# Patient Record
Sex: Male | Born: 1985 | Race: White | Hispanic: No | Marital: Single | Smoking: Never smoker
Health system: Southern US, Community
[De-identification: ages and names within clinical notes are randomized; demographics above are authoritative.]

## PROBLEM LIST (undated history)

## (undated) DIAGNOSIS — E559 Vitamin D deficiency, unspecified: Secondary | ICD-10-CM

## (undated) DIAGNOSIS — E7800 Pure hypercholesterolemia, unspecified: Secondary | ICD-10-CM

## (undated) DIAGNOSIS — I10 Essential (primary) hypertension: Secondary | ICD-10-CM

## (undated) DIAGNOSIS — R748 Abnormal levels of other serum enzymes: Secondary | ICD-10-CM

## (undated) HISTORY — PX: TONSILLECTOMY: SUR1361

---

## 2007-04-03 ENCOUNTER — Emergency Department (HOSPITAL_COMMUNITY): Admission: EM | Admit: 2007-04-03 | Discharge: 2007-04-04 | Payer: Self-pay | Admitting: Emergency Medicine

## 2012-10-11 ENCOUNTER — Ambulatory Visit (INDEPENDENT_AMBULATORY_CARE_PROVIDER_SITE_OTHER): Payer: BC Managed Care – PPO | Admitting: Family Medicine

## 2012-10-11 VITALS — BP 144/79 | HR 112 | Temp 101.0°F | Resp 16 | Ht 70.0 in | Wt 177.0 lb

## 2012-10-11 DIAGNOSIS — D239 Other benign neoplasm of skin, unspecified: Secondary | ICD-10-CM

## 2012-10-11 DIAGNOSIS — D229 Melanocytic nevi, unspecified: Secondary | ICD-10-CM

## 2012-10-11 DIAGNOSIS — IMO0001 Reserved for inherently not codable concepts without codable children: Secondary | ICD-10-CM

## 2012-10-11 DIAGNOSIS — M791 Myalgia, unspecified site: Secondary | ICD-10-CM

## 2012-10-11 DIAGNOSIS — R110 Nausea: Secondary | ICD-10-CM

## 2012-10-11 DIAGNOSIS — R509 Fever, unspecified: Secondary | ICD-10-CM

## 2012-10-11 DIAGNOSIS — J111 Influenza due to unidentified influenza virus with other respiratory manifestations: Secondary | ICD-10-CM

## 2012-10-11 LAB — POCT INFLUENZA A/B: Influenza B, POC: NEGATIVE

## 2012-10-11 MED ORDER — HYDROCODONE-HOMATROPINE 5-1.5 MG/5ML PO SYRP: 5.0000 mL | ORAL_SOLUTION | ORAL | Status: DC | PRN

## 2012-10-11 MED ORDER — OSELTAMIVIR PHOSPHATE 75 MG PO CAPS: 75.0000 mg | ORAL_CAPSULE | Freq: Two times a day (BID) | ORAL | Status: DC

## 2012-10-11 MED ORDER — PROMETHAZINE HCL 25 MG PO TABS: 25.0000 mg | ORAL_TABLET | Freq: Three times a day (TID) | ORAL | Status: DC | PRN

## 2012-10-11 NOTE — Patient Instructions (Addendum)
I will arrange when to remove the moles.  Take photos of them.    Influenza A (H1N1) H1N1 formerly called "swine flu" is a new influenza virus causing sickness in people. The H1N1 virus is different from seasonal influenza viruses. However, the H1N1 symptoms are similar to seasonal influenza and it is spread from person to person. You may be at higher risk for serious problems if you have underlying serious medical conditions. The CDC and the [employer] are following reported cases around the world. CAUSES   The flu is thought to spread mainly person-to-person through coughing or sneezing of infected people.  A person may become infected by touching something with the virus on it and then touching their mouth or nose. SYMPTOMS   Fever.  Headache.  Tiredness.  Cough.  Sore throat.  Runny or stuffy nose.  Body aches.  Diarrhea and vomiting These symptoms are referred to as "flu-like symptoms." A lot of different illnesses, including the common cold, may have similar symptoms. DIAGNOSIS   There are tests that can tell if you have the H1N1 virus.  Confirmed cases of H1N1 will be reported to the state or local health department.  A doctor's exam may be needed to tell whether you have an infection that is a complication of the flu. HOME CARE INSTRUCTIONS   Stay informed. Visit the [facility] website for current recommendations. Visit [employer]. You may also call 1-800-CDC-INFO (905-824-4225).  Get help early if you develop any of the above symptoms.  If you are at high risk from complications of the flu, talk to your caregiver as soon as you develop flu-like symptoms. Those at higher risk for complications include:  People 65 years or older.  People with chronic medical conditions.  Pregnant women.  Young children.  Your caregiver may recommend antiviral medicine to help treat the flu.  If you get the flu, get plenty of rest, drink enough water and  fluids to keep your urine clear or pale yellow, and avoid using alcohol or tobacco.  You may take over-the-counter medicine to relieve the symptoms of the flu if your caregiver approves. (Never give aspirin to children or teenagers who have flu-like symptoms, particularly fever). TREATMENT  If you do get sick, antiviral drugs are available. These drugs can make your illness milder and make you feel better faster. Treatment should start soon after illness starts. It is only effective if taken within the first day of becoming ill. Only your caregiver can prescribe antiviral medication.  PREVENTION   Cover your nose and mouth with a tissue or your arm when you cough or sneeze. Throw the tissue away.  Wash your hands often with soap and warm water, especially after you cough or sneeze. Alcohol-based cleaners are also effective against germs.  Avoid touching your eyes, nose or mouth. This is one way germs spread.  Try to avoid contact with sick people. Follow public health advice regarding school closures. Avoid crowds.  Stay home if you get sick. Limit contact with others to keep from infecting them. People infected with the H1N1 virus may be able to infect others anywhere from 1 day before feeling sick to 5-7 days after getting flu symptoms.  An H1N1 vaccine is available to help protect against the virus. In addition to the H1N1 vaccine, you will need to be vaccinated for seasonal influenza. The H1N1 and seasonal vaccines may be given on the same day. The CDC especially recommends the H1N1 vaccine for:  Pregnant women.  People who live with or care for children younger than 60 months of age.  Health care and emergency services personnel.  Persons between the ages of 46 months through 23 years of age.  People from ages 21 through 14 years who are at higher risk for H1N1 because of chronic health disorders or immune system problems. FACEMASKS In community and home settings, the use of facemasks  and N95 respirators are not normally recommended. In certain circumstances, a facemask or N95 respirator may be used for persons at increased risk of severe illness from influenza. Your caregiver can give additional recommendations for facemask use. IN CHILDREN, EMERGENCY WARNING SIGNS THAT NEED URGENT MEDICAL CARE:  Fast breathing or trouble breathing.  Bluish skin color.  Not drinking enough fluids.  Not waking up or not interacting normally.  Being so fussy that the child does not want to be held.  Your child has an oral temperature above 102 F (38.9 C), not controlled by medicine.  Your baby is older than 3 months with a rectal temperature of 102 F (38.9 C) or higher.  Your baby is 89 months old or younger with a rectal temperature of 100.4 F (38 C) or higher.  Flu-like symptoms improve but then return with fever and worse cough. IN ADULTS, EMERGENCY WARNING SIGNS THAT NEED URGENT MEDICAL CARE:  Difficulty breathing or shortness of breath.  Pain or pressure in the chest or abdomen.  Sudden dizziness.  Confusion.  Severe or persistent vomiting.  Bluish color.  You have a oral temperature above 102 F (38.9 C), not controlled by medicine.  Flu-like symptoms improve but return with fever and worse cough. SEEK IMMEDIATE MEDICAL CARE IF:  You or someone you know is experiencing any of the above symptoms. When you arrive at the emergency center, report that you think you have the flu. You may be asked to wear a mask and/or sit in a secluded area to protect others from getting sick. MAKE SURE YOU:   Understand these instructions.  Will watch your condition.  Will get help right away if you are not doing well or get worse. Some of this information courtesy of the CDC.  Document Released: 02/15/2008 Document Revised: 11/21/2011 Document Reviewed: 02/15/2008 [facility] Patient Information 2013 [place], [place].

## 2012-10-11 NOTE — Progress Notes (Signed)
Subjective: Patient has been having a lot of aching, fever, some nausea, and just feeling lousy the last couple of days. Actually started night before last.  Also he has some moles his wife insisted he get checked.  Objective TMs normal. Nose clear. Throat clear. Neck supple without nodes. Chest clear to auscultation. Heart regular without murmurs. He has scattered compound nevi on his back. He says the one on the left lower back has been growing more rapidly and is concerned about that. He has a pink looking lesion on his left scapula.   Results for orders placed in visit on 10/11/12  POCT INFLUENZA A/B      Component Value Range   Influenza A, POC Positive     Influenza B, POC Negative     Assessment: Influenza A Multiple compound nevi, some of them changing, all benign-appearing Hypopigmentary lesion left scapula  Plan: He is concerned about skin lesions. I told him we would take the one that is growing off some time, as well as the take place on his left shoulder. I will try to contact him on this. He will call me if he does not hear from me in the next couple weeks.

## 2013-09-22 ENCOUNTER — Emergency Department (HOSPITAL_COMMUNITY): Payer: BC Managed Care – PPO

## 2013-09-22 ENCOUNTER — Emergency Department (HOSPITAL_COMMUNITY)
Admission: EM | Admit: 2013-09-22 | Discharge: 2013-09-22 | Disposition: A | Discharge status: Home / Self Care | Payer: BC Managed Care – PPO | Attending: Emergency Medicine | Admitting: Emergency Medicine

## 2013-09-22 ENCOUNTER — Encounter (HOSPITAL_COMMUNITY): Payer: Self-pay | Admitting: Emergency Medicine

## 2013-09-22 DIAGNOSIS — S61451A Open bite of right hand, initial encounter: Secondary | ICD-10-CM

## 2013-09-22 DIAGNOSIS — Z792 Long term (current) use of antibiotics: Secondary | ICD-10-CM | POA: Insufficient documentation

## 2013-09-22 DIAGNOSIS — S61409A Unspecified open wound of unspecified hand, initial encounter: Secondary | ICD-10-CM | POA: Insufficient documentation

## 2013-09-22 DIAGNOSIS — W540XXA Bitten by dog, initial encounter: Secondary | ICD-10-CM | POA: Insufficient documentation

## 2013-09-22 DIAGNOSIS — Y9389 Activity, other specified: Secondary | ICD-10-CM | POA: Insufficient documentation

## 2013-09-22 DIAGNOSIS — Y929 Unspecified place or not applicable: Secondary | ICD-10-CM | POA: Insufficient documentation

## 2013-09-22 DIAGNOSIS — S61509A Unspecified open wound of unspecified wrist, initial encounter: Secondary | ICD-10-CM | POA: Insufficient documentation

## 2013-09-22 MED ORDER — AMOXICILLIN-POT CLAVULANATE 875-125 MG PO TABS
1.0000 | ORAL_TABLET | Freq: Once | ORAL | Status: AC
  Administered 2013-09-22: 1 via ORAL
  Filled 2013-09-22: qty 1

## 2013-09-22 MED ORDER — OXYCODONE-ACETAMINOPHEN 5-325 MG PO TABS: 2.0000 | ORAL_TABLET | Freq: Four times a day (QID) | ORAL | Status: DC | PRN

## 2013-09-22 MED ORDER — OXYCODONE-ACETAMINOPHEN 5-325 MG PO TABS
2.0000 | ORAL_TABLET | Freq: Once | ORAL | Status: AC
  Administered 2013-09-22: 2 via ORAL
  Filled 2013-09-22: qty 2

## 2013-09-22 MED ORDER — AMOXICILLIN-POT CLAVULANATE 875-125 MG PO TABS: 1.0000 | ORAL_TABLET | Freq: Two times a day (BID) | ORAL | Status: DC

## 2013-09-22 NOTE — ED Provider Notes (Signed)
CSN: [id number]     Arrival date & time 09/22/13  0123 History   First MD Initiated Contact with Patient 09/22/13 0407     Chief Complaint  Patient presents with  . Animal Bite   (Consider location/radiation/quality/duration/timing/severity/associated sxs/prior Treatment) HPI 28 year old male presents for his apartment with complaint of dog bite to right hand.  He reports that his dog bit him.  This evening when he was trying to get him off the bed.  Patient reports daughter.  He was shot is up-to-date.  His tetanus shot is up to date.  He denies any difficulty moving his fingers.  No redness, swelling, or drainage of pus.  At this time.  He does have some pain with movement of his hand, wrist, and fingers due to bite.  No antibiotic allergies. History reviewed. No pertinent past medical history. Past Surgical History  Procedure Laterality Date  . Tonsillectomy     No family history on file. History  Substance Use Topics  . Smoking status: Never Smoker   . Smokeless tobacco: Not on file  . Alcohol Use: No    Review of Systems  See History of Present Illness; otherwise all other systems are reviewed and negative Allergies  Ketorolac  Home Medications   Current Outpatient Rx  Name  Route  Sig  Dispense  Refill  . amoxicillin-clavulanate (AUGMENTIN) 875-125 MG per tablet   Oral   Take 1 tablet by mouth 2 (two) times daily.   20 tablet   0   . oxyCODONE-acetaminophen (PERCOCET/ROXICET) 5-325 MG per tablet   Oral   Take 2 tablets by mouth every 6 (six) hours as needed for severe pain.   20 tablet   0    BP 140/96  Pulse 102  Temp(Src) 97.7 F (36.5 C) (Oral)  Resp 20  Wt 215 lb (97.523 kg)  SpO2 98% Physical Exam  Nursing note and vitals reviewed. Musculoskeletal: He exhibits tenderness.  Patient with multiple abrasions and puncture wounds over right wrist, and proximal hand.  No deep, jagged or extensive lacerations.  Patient with some decreased range of motion due  to pain.  But is able to flex and extend at all digits and joints.  No crepitus.  He is neurovascularly intact    ED Course  Procedures (including critical care time) Labs Review Labs Reviewed - No data to display Imaging Review Dg Wrist Complete Right  09/22/2013   CLINICAL DATA:  Status post dog bite.  Right wrist pain.  EXAM: RIGHT WRIST - COMPLETE 3+ VIEW  COMPARISON:  None.  FINDINGS: Imaged bones, joints and soft tissues appear normal.  IMPRESSION: Negative exam.   Electronically Signed   By: Inge Rise M.D.   On: 09/22/2013 04:38   Dg Hand Complete Right  09/22/2013   CLINICAL DATA:  Status post dog bite right hand.  EXAM: RIGHT HAND - COMPLETE 3+ VIEW  COMPARISON:  None.  FINDINGS: Imaged bones, joints and soft tissues appear normal.  IMPRESSION: Negative exam.   Electronically Signed   By: Inge Rise M.D.   On: 09/22/2013 04:38    EKG Interpretation   None       MDM   1. Dog bite of right hand    28 year old male with dogbite of the hand.  Start on Augmentin.  All immunizations are up to date for him and the dog.  Will treat with Percocet for his pain.  He is instructed to return to doctor in 2 days for  recheck    Kalman Drape, MD 09/22/13 [phone]

## 2013-09-22 NOTE — Discharge Instructions (Signed)
Keep hand elevated to help with pain and swelling.  Ice to area may help with swelling as well.  Take antibiotics as prescribed.  Follow up with a doctor in 2 days for recheck of your wounds. Return sooner for worsening pain, fever, increased redness or streaking of red up the arm.    Animal Bite An animal bite can result in a scratch on the skin, deep open cut, puncture of the skin, crush injury, or tearing away of the skin or a body part. Dogs are responsible for most animal bites. Children are bitten more often than adults. An animal bite can range from very mild to more serious. A small bite from your house pet is no cause for alarm. However, some animal bites can become infected or injure a bone or other tissue. You must seek medical care if:  The skin is broken and bleeding does not slow down or stop after 15 minutes.  The puncture is deep and difficult to clean (such as a cat bite).  Pain, warmth, redness, or pus develops around the wound.  The bite is from a stray animal or rodent. There may be a risk of rabies infection.  The bite is from a snake, raccoon, skunk, fox, coyote, or bat. There may be a risk of rabies infection.  The person bitten has a chronic illness such as diabetes, liver disease, or cancer, or the person takes medicine that lowers the immune system.  There is concern about the location and severity of the bite. It is important to clean and protect an animal bite wound right away to prevent infection. Follow these steps:  Clean the wound with plenty of water and soap.  Apply an antibiotic cream.  Apply gentle pressure over the wound with a clean towel or gauze to slow or stop bleeding.  Elevate the affected area above the heart to help stop any bleeding.  Seek medical care. Getting medical care within 8 hours of the animal bite leads to the best possible outcome. DIAGNOSIS  Your caregiver will most likely:  Take a detailed history of the animal and the bite  injury.  Perform a wound exam.  Take your medical history. Blood tests or X-rays may be performed. Sometimes, infected bite wounds are cultured and sent to a lab to identify the infectious bacteria.  TREATMENT  Medical treatment will depend on the location and type of animal bite as well as the patient's medical history. Treatment may include:  Wound care, such as cleaning and flushing the wound with saline solution, bandaging, and elevating the affected area.  Antibiotics.  Tetanus immunization.  Rabies immunization.  Leaving the wound open to heal. This is often done with animal bites, due to the high risk of infection. However, in certain cases, wound closure with stitches, wound adhesive, skin adhesive strips, or staples may be used. Infected bites that are left untreated may require intravenous (IV) antibiotics and surgical treatment in the hospital. [place]  Follow your caregiver's instructions for wound care.  Take all medicines as directed.  If your caregiver prescribes antibiotics, take them as directed. Finish them even if you start to feel better.  Follow up with your caregiver for further exams or immunizations as directed. You may need a tetanus shot if:  You cannot remember when you had your last tetanus shot.  You have never had a tetanus shot.  The injury broke your skin. If you get a tetanus shot, your arm may swell, get red,  and feel warm to the touch. This is common and not a problem. If you need a tetanus shot and you choose not to have one, there is a rare chance of getting tetanus. Sickness from tetanus can be serious. SEEK MEDICAL CARE IF:  You notice warmth, redness, soreness, swelling, pus discharge, or a bad smell coming from the wound.  You have a red line on the skin coming from the wound.  You have a fever, chills, or a general ill feeling.  You have nausea or vomiting.  You have continued or worsening pain.  You have  trouble moving the injured part.  You have other questions or concerns. MAKE SURE YOU:  Understand these instructions.  Will watch your condition.  Will get help right away if you are not doing well or get worse. Document Released: 05/17/2011 Document Revised: 11/21/2011 Document Reviewed: 05/17/2011 [facility] Patient Information 2014 [place].

## 2013-09-22 NOTE — ED Notes (Signed)
Pt. was bitten by his dog this evening , presents with multiple puncture wounds at right wrist/hand , no bleeding , immunizations are complete . Dressing applied at triage.

## 2014-10-30 ENCOUNTER — Emergency Department (HOSPITAL_COMMUNITY)
Admission: EM | Admit: 2014-10-30 | Discharge: 2014-10-30 | Disposition: A | Discharge status: Home / Self Care | Payer: BLUE CROSS/BLUE SHIELD | Attending: Emergency Medicine | Admitting: Emergency Medicine

## 2014-10-30 ENCOUNTER — Encounter (HOSPITAL_COMMUNITY): Payer: Self-pay | Admitting: *Deleted

## 2014-10-30 ENCOUNTER — Emergency Department (HOSPITAL_COMMUNITY): Payer: BLUE CROSS/BLUE SHIELD

## 2014-10-30 DIAGNOSIS — Z23 Encounter for immunization: Secondary | ICD-10-CM | POA: Insufficient documentation

## 2014-10-30 DIAGNOSIS — Y9289 Other specified places as the place of occurrence of the external cause: Secondary | ICD-10-CM | POA: Insufficient documentation

## 2014-10-30 DIAGNOSIS — S41151A Open bite of right upper arm, initial encounter: Secondary | ICD-10-CM

## 2014-10-30 DIAGNOSIS — Y998 Other external cause status: Secondary | ICD-10-CM | POA: Insufficient documentation

## 2014-10-30 DIAGNOSIS — S41111A Laceration without foreign body of right upper arm, initial encounter: Secondary | ICD-10-CM | POA: Insufficient documentation

## 2014-10-30 DIAGNOSIS — Y9389 Activity, other specified: Secondary | ICD-10-CM | POA: Diagnosis not present

## 2014-10-30 DIAGNOSIS — M79605 Pain in left leg: Secondary | ICD-10-CM

## 2014-10-30 DIAGNOSIS — W540XXA Bitten by dog, initial encounter: Secondary | ICD-10-CM | POA: Diagnosis not present

## 2014-10-30 DIAGNOSIS — S80212A Abrasion, left knee, initial encounter: Secondary | ICD-10-CM | POA: Diagnosis not present

## 2014-10-30 MED ORDER — AMOXICILLIN-POT CLAVULANATE 875-125 MG PO TABS: 1.0000 | ORAL_TABLET | Freq: Two times a day (BID) | ORAL | Status: DC

## 2014-10-30 MED ORDER — TRAMADOL HCL 50 MG PO TABS: 50.0000 mg | ORAL_TABLET | Freq: Four times a day (QID) | ORAL | Status: DC | PRN

## 2014-10-30 MED ORDER — TETANUS-DIPHTH-ACELL PERTUSSIS 5-2.5-18.5 LF-MCG/0.5 IM SUSP
0.5000 mL | Freq: Once | INTRAMUSCULAR | Status: AC
  Administered 2014-10-30: 0.5 mL via INTRAMUSCULAR
  Filled 2014-10-30: qty 0.5

## 2014-10-30 NOTE — ED Provider Notes (Signed)
CSN: [id number]     Arrival date & time 10/30/14  1716 History  This chart was scribed for non-physician practitioner, Noland Fordyce, PA-C, working with Richarda Blade, MD, by Delphia Grates, ED Scribe. This patient was seen in room TR10C/TR10C and the patient's care was started at 5:48 PM.     Chief Complaint  Patient presents with  . Animal Bite  . Leg Pain    The history is provided by the patient. No language interpreter was used.     HPI Comments: Bobby Pennington is a 29 y.o. male, with no significant medical history, who presents to the Emergency Department complaining of dog bite to the right arm that occurred less than 1 hour ago. Patient states he was bitten by his neighbor's pitbull/rotweiler mix while he was trying to prevent it from attacking a smaller dog. He also reports that in the midst of the struggle, his left leg hit a tree stump. There is associated lacerations to the right arm, abrasions to the left lower leg, in addition to right arm and left lower leg pain. Left leg pain is aching and sore, 7/10, worse with ambulation.  He states he is unsure if the dog has been vaccinated. He denies any other injuries. No fever or chills.    History reviewed. No pertinent past medical history. Past Surgical History  Procedure Laterality Date  . Tonsillectomy     History reviewed. No pertinent family history. History  Substance Use Topics  . Smoking status: Never Smoker   . Smokeless tobacco: Not on file  . Alcohol Use: No    Review of Systems  Constitutional: Negative for fever and chills.  Musculoskeletal: Positive for myalgias.  Skin: Positive for wound.      Allergies  Ketorolac  Home Medications   Prior to Admission medications   Medication Sig Start Date End Date Taking? Authorizing Provider  amoxicillin-clavulanate (AUGMENTIN) 875-125 MG per tablet Take 1 tablet by mouth 2 (two) times daily. 09/22/13   Kalman Drape, MD  amoxicillin-clavulanate (AUGMENTIN)  875-125 MG per tablet Take 1 tablet by mouth every 12 (twelve) hours. 10/30/14   Noland Fordyce, PA-C  oxyCODONE-acetaminophen (PERCOCET/ROXICET) 5-325 MG per tablet Take 2 tablets by mouth every 6 (six) hours as needed for severe pain. 09/22/13   Kalman Drape, MD  traMADol (ULTRAM) 50 MG tablet Take 1 tablet (50 mg total) by mouth every 6 (six) hours as needed. 10/30/14   Noland Fordyce, PA-C   Triage Vitals: BP 128/81 mmHg  Pulse 115  Temp(Src) 98.5 F (36.9 C) (Oral)  Resp 18  Ht 6' (1.829 m)  Wt 222 lb (100.699 kg)  BMI 30.10 kg/m2  SpO2 98%  Physical Exam  Constitutional: He is oriented to person, place, and time. He appears well-developed and well-nourished.  HENT:  Head: Normocephalic and atraumatic.  Eyes: EOM are normal.  Neck: Normal range of motion.  Cardiovascular: Normal rate.   Pulmonary/Chest: Effort normal.  Musculoskeletal: Normal range of motion.  Left leg: no deformity, Full ROM of left knee. Tenderness to lateral inferior aspect of left knee. Superficial abrasion. No ecchymosis.  Neurological: He is alert and oriented to person, place, and time.  Skin: Skin is warm and dry. Laceration noted.  Right arm: Two 4cm linear superficial lacerations on dorsal aspect of forearm. 4 superficial puncture wounds to volar aspect.  Psychiatric: He has a normal mood and affect. His behavior is normal.  Nursing note and vitals reviewed.   ED Course  Procedures    The wound is cleansed, debrided of foreign material as much as possible, and dressed. The patient is alerted to watch for any signs of infection (redness, pus, pain, increased swelling or fever) and call if such occurs. Home wound care instructions are provided. Tetanus vaccination status reviewed: Tdap given in ED today.    DIAGNOSTIC STUDIES: Oxygen Saturation is 98% on room air, normal by my interpretation.    COORDINATION OF CARE: At 8887 Discussed treatment plan with patient which includes pain medication.  Patient agrees.   Labs Review Labs Reviewed - No data to display  Imaging Review Dg Knee Complete 4 Views Left  10/30/2014   CLINICAL DATA:  Fall, left knee pain  EXAM: LEFT KNEE - COMPLETE 4+ VIEW  COMPARISON:  None.  FINDINGS: No fracture or dislocation is seen.  The joint spaces are preserved.  The visualized soft tissues are unremarkable.  No suprapatellar knee joint effusion.  IMPRESSION: No fracture or dislocation is seen.   Electronically Signed   By: Julian Hy M.D.   On: 10/30/2014 19:05     EKG Interpretation None      MDM   Final diagnoses:  Dog bite of right arm, initial encounter  Left leg pain    Pt is a 29yo male presenting to ED with dog bite of right arm, abrasion to left leg.  Animal control was contacted as dog belongs to a neighbor.  Advised pt to f/u with animal control to have confirmation of dog being UTD on immunizations or to ensure dog can be monitored. Will hold off on rabies prophylaxis at this time. Wounds to right arm appear superficial. Thoroughly irrigated. Bacitracin ointment and bandage applied.  Rx: tramadol and Augmentin.  Advised to f/u with PCP as needed for wound recheck Return precautions provided. Pt verbalized understanding and agreement with tx plan.   I personally performed the services described in this documentation, which was scribed in my presence. The recorded information has been reviewed and is accurate.    Noland Fordyce, PA-C 10/30/14 [place], MD 10/31/14 [phone]

## 2014-10-30 NOTE — ED Notes (Signed)
Pt reports dog bite to right arm and left lower leg pain. Pt states he was picking up the leash from his neighbors dog when suddenly the dog bit his right arm. Pt presents with puncture wounds and abrasions to right arm. Pt was also knocked down injuring his left knee. Pt does not know if dog is up to date on any vaccinations.

## 2014-10-30 NOTE — ED Notes (Signed)
Cleaned wound with saline, and wound spray, applied bacterian.

## 2015-08-23 ENCOUNTER — Emergency Department (HOSPITAL_COMMUNITY): Payer: BLUE CROSS/BLUE SHIELD

## 2015-08-23 ENCOUNTER — Encounter (HOSPITAL_COMMUNITY): Payer: Self-pay

## 2015-08-23 ENCOUNTER — Emergency Department (HOSPITAL_COMMUNITY)
Admission: EM | Admit: 2015-08-23 | Discharge: 2015-08-24 | Disposition: A | Discharge status: Home / Self Care | Payer: BLUE CROSS/BLUE SHIELD | Attending: Emergency Medicine | Admitting: Emergency Medicine

## 2015-08-23 DIAGNOSIS — Z79899 Other long term (current) drug therapy: Secondary | ICD-10-CM | POA: Insufficient documentation

## 2015-08-23 DIAGNOSIS — R079 Chest pain, unspecified: Secondary | ICD-10-CM | POA: Diagnosis not present

## 2015-08-23 DIAGNOSIS — M546 Pain in thoracic spine: Secondary | ICD-10-CM | POA: Insufficient documentation

## 2015-08-23 DIAGNOSIS — R000 Tachycardia, unspecified: Secondary | ICD-10-CM

## 2015-08-23 DIAGNOSIS — M549 Dorsalgia, unspecified: Secondary | ICD-10-CM

## 2015-08-23 DIAGNOSIS — E559 Vitamin D deficiency, unspecified: Secondary | ICD-10-CM | POA: Insufficient documentation

## 2015-08-23 DIAGNOSIS — M542 Cervicalgia: Secondary | ICD-10-CM | POA: Diagnosis not present

## 2015-08-23 DIAGNOSIS — I10 Essential (primary) hypertension: Secondary | ICD-10-CM | POA: Diagnosis not present

## 2015-08-23 HISTORY — DX: Pure hypercholesterolemia, unspecified: E78.00

## 2015-08-23 HISTORY — DX: Essential (primary) hypertension: I10

## 2015-08-23 HISTORY — DX: Abnormal levels of other serum enzymes: R74.8

## 2015-08-23 HISTORY — DX: Vitamin D deficiency, unspecified: E55.9

## 2015-08-23 LAB — CBC
HCT: 45.1 % (ref 39.0–52.0)
Hemoglobin: 15.3 g/dL (ref 13.0–17.0)
MCH: 30.1 pg (ref 26.0–34.0)
MCHC: 33.9 g/dL (ref 30.0–36.0)
MCV: 88.8 fL (ref 78.0–100.0)
Platelets: 249 10*3/uL (ref 150–400)
RBC: 5.08 MIL/uL (ref 4.22–5.81)
RDW: 12.3 % (ref 11.5–15.5)
WBC: 9.6 10*3/uL (ref 4.0–10.5)

## 2015-08-23 LAB — TSH: TSH: 0.877 u[IU]/mL (ref 0.350–4.500)

## 2015-08-23 LAB — BASIC METABOLIC PANEL
Anion gap: 8 (ref 5–15)
BUN: 9 mg/dL (ref 6–20)
CO2: 25 mmol/L (ref 22–32)
Calcium: 9.3 mg/dL (ref 8.9–10.3)
Chloride: 105 mmol/L (ref 101–111)
Creatinine, Ser: 1.19 mg/dL (ref 0.61–1.24)
GFR calc Af Amer: >60 mL/min (ref >60)
GFR calc non Af Amer: >60 mL/min (ref >60)
Glucose, Bld: 126 mg/dL — ABNORMAL HIGH (ref 65–99)
Potassium: 4.1 mmol/L (ref 3.5–5.1)
Sodium: 138 mmol/L (ref 135–145)

## 2015-08-23 LAB — I-STAT TROPONIN, ED: Troponin i, poc: 0 ng/mL (ref 0.00–0.08)

## 2015-08-23 MED ORDER — SODIUM CHLORIDE 0.9 % IV BOLUS (SEPSIS)
1000.0000 mL | Freq: Once | INTRAVENOUS | Status: AC
  Administered 2015-08-23: 1000 mL via INTRAVENOUS

## 2015-08-23 MED ORDER — IOHEXOL 350 MG/ML SOLN
100.0000 mL | Freq: Once | INTRAVENOUS | Status: AC | PRN
  Administered 2015-08-23: 100 mL via INTRAVENOUS

## 2015-08-23 NOTE — ED Notes (Signed)
Pt woke up having pain in his neck and left shoulder pain and upper back and thinks he just slept wrong. Also reports that as the day progressed he started having mid epigastric pain and hurt to take deep breaths.

## 2015-08-24 MED ORDER — MELOXICAM 15 MG PO TABS: 15.0000 mg | ORAL_TABLET | Freq: Every day | ORAL | Status: DC | PRN

## 2015-08-24 NOTE — Discharge Instructions (Signed)
Back Pain, Adult °Back pain is very common in adults. The cause of back pain is rarely dangerous and the pain often gets better over time. The cause of your back pain may not be known. Some common causes of back pain include: °· Strain of the muscles or ligaments supporting the spine. °· Wear and tear (degeneration) of the spinal disks. °· Arthritis. °· Direct injury to the back. °For many people, back pain may return. Since back pain is rarely dangerous, most people can learn to manage this condition on their own. °HOME CARE INSTRUCTIONS °Watch your back pain for any changes. The following actions may help to lessen any discomfort you are feeling: °· Remain active. It is stressful on your back to sit or stand in one place for long periods of time. Do not sit, drive, or stand in one place for more than 30 minutes at a time. Take short walks on even surfaces as soon as you are able. Try to increase the length of time you walk each day. °· Exercise regularly as directed by your health care provider. Exercise helps your back heal faster. It also helps avoid future injury by keeping your muscles strong and flexible. °· Do not stay in bed. Resting more than 1-2 days can delay your recovery. °· Pay attention to your body when you bend and lift. The most comfortable positions are those that put less stress on your recovering back. Always use proper lifting techniques, including: °· Bending your knees. °· Keeping the load close to your body. °· Avoiding twisting. °· Find a comfortable position to sleep. Use a firm mattress and lie on your side with your knees slightly bent. If you lie on your back, put a pillow under your knees. °· Avoid feeling anxious or stressed. Stress increases muscle tension and can worsen back pain. It is important to recognize when you are anxious or stressed and learn ways to manage it, such as with exercise. °· Take medicines only as directed by your health care provider. Over-the-counter  medicines to reduce pain and inflammation are often the most helpful. Your health care provider may prescribe muscle relaxant drugs. These medicines help dull your pain so you can more quickly return to your normal activities and healthy exercise. °· Apply ice to the injured area: °· Put ice in a plastic bag. °· Place a towel between your skin and the bag. °· Leave the ice on for 20 minutes, 2-3 times a day for the first 2-3 days. After that, ice and heat may be alternated to reduce pain and spasms. °· Maintain a healthy weight. Excess weight puts extra stress on your back and makes it difficult to maintain good posture. °SEEK MEDICAL CARE IF: °· You have pain that is not relieved with rest or medicine. °· You have increasing pain going down into the legs or buttocks. °· You have pain that does not improve in one week. °· You have night pain. °· You lose weight. °· You have a fever or chills. °SEEK IMMEDIATE MEDICAL CARE IF:  °· You develop new bowel or bladder control problems. °· You have unusual weakness or numbness in your arms or legs. °· You develop nausea or vomiting. °· You develop abdominal pain. °· You feel faint. °  °This information is not intended to replace advice given to you by your health care provider. Make sure you discuss any questions you have with your health care provider. °  °Document Released: 08/29/2005 Document Revised: 09/19/2014 Document Reviewed: 12/31/2013 °Elsevier Interactive Patient Education ©2016 [employer]   [employer]  Nonspecific Tachycardia Tachycardia is a faster than normal heartbeat (more than 100 beats per minute). In adults, the heart normally beats between 60 and 100 times a minute. A fast heartbeat may be a normal response to exercise or stress. It does not necessarily mean that something is wrong. However, sometimes when your heart beats too fast it may not be able to pump enough blood to the rest of your body. This can result in chest pain, shortness of breath, dizziness,  and even fainting. Nonspecific tachycardia means that the specific cause or pattern of your tachycardia is unknown. CAUSES  Tachycardia may be harmless or it may be due to a more serious underlying cause. Possible causes of tachycardia include:  Exercise or exertion.  Fever.  Pain or injury.  Infection.  Loss of body fluids (dehydration).  Overactive thyroid.  Lack of red blood cells (anemia).  Anxiety and stress.  Alcohol.  Caffeine.  Tobacco products.  Diet pills.  Illegal drugs.  Heart disease. SYMPTOMS  Rapid or irregular heartbeat (palpitations).  Suddenly feeling your heart beating (cardiac awareness).  Dizziness.  Tiredness (fatigue).  Shortness of breath.  Chest pain.  Nausea.  Fainting. DIAGNOSIS  Your caregiver will perform a physical exam and take your medical history. In some cases, a heart specialist (cardiologist) may be consulted. Your caregiver may also order:  Blood tests.  Electrocardiography. This test records the electrical activity of your heart.  A heart monitoring test. TREATMENT  Treatment will depend on the likely cause of your tachycardia. The goal is to treat the underlying cause of your tachycardia. Treatment methods may include:  Replacement of fluids or blood through an intravenous (IV) tube for moderate to severe dehydration or anemia.  New medicines or changes in your current medicines.  Diet and lifestyle changes.  Treatment for certain infections.  Stress relief or relaxation methods. HOME CARE INSTRUCTIONS   Rest.  Drink enough fluids to keep your urine clear or pale yellow.  Do not smoke.  Avoid:  Caffeine.  Tobacco.  Alcohol.  Chocolate.  Stimulants such as over-the-counter diet pills or pills that help you stay awake.  Situations that cause anxiety or stress.  Illegal drugs such as marijuana, phencyclidine (PCP), and cocaine.  Only take medicine as directed by your caregiver.  Keep all  follow-up appointments as directed by your caregiver. SEEK IMMEDIATE MEDICAL CARE IF:   You have pain in your chest, upper arms, jaw, or neck.  You become weak, dizzy, or feel faint.  You have palpitations that will not go away.  You vomit, have diarrhea, or pass blood in your stool.  Your skin is cool, pale, and wet.  You have a fever that will not go away with rest, fluids, and medicine. MAKE SURE YOU:   Understand these instructions.  Will watch your condition.  Will get help right away if you are not doing well or get worse.   This information is not intended to replace advice given to you by your health care provider. Make sure you discuss any questions you have with your health care provider.   Document Released: 10/06/2004 Document Revised: 11/21/2011 Document Reviewed: 03/13/2015 Elsevier Interactive Patient Education [employer].

## 2015-09-01 NOTE — ED Provider Notes (Signed)
CSN: [id number]     Arrival date & time 08/23/15  2121 History   First MD Initiated Contact with Patient 08/23/15 2145     Chief Complaint  Patient presents with  . Neck Pain  . Back Pain  . Chest Pain     (Consider location/radiation/quality/duration/timing/severity/associated sxs/prior Treatment) HPI   29yM with upper back pain. Onset this morning when woke up. Progressive through out day. Felt initially that may have slept akwardly. Denies recent trauma, strain, overuse. No respiratory complaints. No numbness, tingling or loss of strength. No fever or chills. Has had issues with lower back pain, but not symptoms like this before.   Past Medical History  Diagnosis Date  . Hypertension   . Vitamin D deficiency   . High cholesterol   . Elevated liver enzymes    Past Surgical History  Procedure Laterality Date  . Tonsillectomy     No family history on file. Social History  Substance Use Topics  . Smoking status: Never Smoker   . Smokeless tobacco: None  . Alcohol Use: No    Review of Systems  All systems reviewed and negative, other than as noted in HPI.   Allergies  Ketorolac  Home Medications   Prior to Admission medications   Medication Sig Start Date End Date Taking? Authorizing Provider  cholecalciferol (VITAMIN D) 1000 UNITS tablet Take 1,000 Units by mouth 2 (two) times daily.   Yes Historical Provider, MD  ibuprofen (ADVIL,MOTRIN) 200 MG tablet Take 200-400 mg by mouth every 6 (six) hours as needed (pain).   Yes Historical Provider, MD  meloxicam (MOBIC) 15 MG tablet Take 1 tablet (15 mg total) by mouth daily as needed for pain. 08/24/15   Virgel Manifold, MD  oxyCODONE-acetaminophen (PERCOCET/ROXICET) 5-325 MG per tablet Take 2 tablets by mouth every 6 (six) hours as needed for severe pain. Patient not taking: Reported on 08/23/2015 09/22/13   Linton Flemings, MD  traMADol (ULTRAM) 50 MG tablet Take 1 tablet (50 mg total) by mouth every 6 (six) hours as  needed. Patient not taking: Reported on 08/23/2015 10/30/14   Noland Fordyce, PA-C   BP 104/65 mmHg  Pulse 116  Temp(Src) 98.6 F (37 C) (Oral)  Resp 15  Ht 6' (1.829 m)  Wt 231 lb 12.8 oz (105.144 kg)  BMI 31.43 kg/m2  SpO2 99% Physical Exam  Constitutional: He appears well-developed and well-nourished. No distress.  HENT:  Head: Normocephalic and atraumatic.  Eyes: Conjunctivae are normal. Right eye exhibits no discharge. Left eye exhibits no discharge.  Neck: Neck supple.  Cardiovascular: Regular rhythm and normal heart sounds.  Exam reveals no gallop and no friction rub.   No murmur heard. Tachycardic. No murmur appreciated.   Pulmonary/Chest: Effort normal and breath sounds normal. No respiratory distress.  Abdominal: Soft. He exhibits no distension. There is no tenderness.  Musculoskeletal: He exhibits no edema or tenderness.  Lower extremities symmetric as compared to each other. No calf tenderness. Negative Homan's. No palpable cords.   Neurological: He is alert.  Skin: Skin is warm and dry.  Psychiatric: He has a normal mood and affect. His behavior is normal. Thought content normal.  Nursing note and vitals reviewed.   ED Course  Procedures (including critical care time) Labs Review Labs Reviewed  BASIC METABOLIC PANEL - Abnormal; Notable for the following:    Glucose, Bld 126 (*)    All other components within normal limits  CBC  TSH  I-STAT TROPOININ, ED    Imaging  Review No results found. I have personally reviewed and evaluated these images and lab results as part of my medical decision-making.   EKG Interpretation   Date/Time:  Sunday August 23 2015 21:37:56 EST Ventricular Rate:  139 PR Interval:  138 QRS Duration: 84 QT Interval:  280 QTC Calculation: 426 R Axis:   75 Text Interpretation:  Sinus tachycardia Possible Left atrial enlargement  Abnormal ECG Confirmed by Korry Dalgleish  MD, Elda Dunkerson ([id number]) on 08/23/2015 10:10:33  PM      MDM   Final  diagnoses:  Upper back pain  Sinus tachycardia ([place])   29yM with upper back pain. Suspect musculoskeletal. Also noted to be very tachycardic. Unclear why. Afebrile. Does not appear dehydrated. Not anemic. Normal tsh. Lytes ok. CT w/o evidence of PE. Denies significant caffeine or drug use. Denies significant etoh. Denies HA, inappropriate sweating, etc to suggest pheochromocytoma.  HR now improved, but remains elevated. Doesn't appear to be emergent process. But i think should have cardiology evaluation for inappropriate sinus tach.    Virgel Manifold, MD 09/01/15 1136

## 2016-12-28 ENCOUNTER — Ambulatory Visit (HOSPITAL_COMMUNITY)
Admission: EM | Admit: 2016-12-28 | Discharge: 2016-12-28 | Disposition: A | Discharge status: Home / Self Care | Payer: Managed Care, Other (non HMO) | Attending: Family Medicine | Admitting: Family Medicine

## 2016-12-28 ENCOUNTER — Encounter (HOSPITAL_COMMUNITY): Payer: Self-pay | Admitting: Emergency Medicine

## 2016-12-28 DIAGNOSIS — R1032 Left lower quadrant pain: Secondary | ICD-10-CM

## 2016-12-28 LAB — POCT URINALYSIS DIP (DEVICE)
Bilirubin Urine: NEGATIVE
Glucose, UA: NEGATIVE mg/dL
HGB URINE DIPSTICK: NEGATIVE
Ketones, ur: NEGATIVE mg/dL
Nitrite: NEGATIVE
PROTEIN: NEGATIVE mg/dL
SPECIFIC GRAVITY, URINE: 1.02 (ref 1.005–1.030)
UROBILINOGEN UA: 0.2 mg/dL (ref 0.0–1.0)
pH: 7.5 (ref 5.0–8.0)

## 2016-12-28 MED ORDER — METRONIDAZOLE 500 MG PO TABS: 500.0000 mg | ORAL_TABLET | Freq: Two times a day (BID) | ORAL | 0 refills | Status: AC

## 2016-12-28 MED ORDER — CIPROFLOXACIN HCL 500 MG PO TABS: 500.0000 mg | ORAL_TABLET | Freq: Two times a day (BID) | ORAL | 0 refills | Status: AC

## 2016-12-28 NOTE — ED Notes (Signed)
Dirty and clean urine collected. 

## 2016-12-28 NOTE — ED Triage Notes (Signed)
The patient presented to the [facility] with a complaint of urinary frequency x 1 week and left flank pain that started 2 days ago.

## 2016-12-28 NOTE — ED Provider Notes (Signed)
CSN: [id number]     Arrival date & time 12/28/16  1056 History   None    Chief Complaint  Patient presents with  . Flank Pain   (Consider location/radiation/quality/duration/timing/severity/associated sxs/prior Treatment) Patient c/o left flank pain and left lower quadrant abdominal pain.   The history is provided by the patient.  Flank Pain  This is a new problem. The problem occurs constantly. The problem has not changed since onset.Nothing aggravates the symptoms. Nothing relieves the symptoms.    Past Medical History:  Diagnosis Date  . Elevated liver enzymes   . High cholesterol   . Hypertension   . Vitamin D deficiency    Past Surgical History:  Procedure Laterality Date  . TONSILLECTOMY     History reviewed. No pertinent family history. Social History  Substance Use Topics  . Smoking status: Never Smoker  . Smokeless tobacco: Not on file  . Alcohol use No    Review of Systems  Constitutional: Negative.   HENT: Negative.   Eyes: Negative.   Respiratory: Negative.   Cardiovascular: Negative.   Gastrointestinal: Positive for abdominal distention.  Endocrine: Negative.   Genitourinary: Positive for flank pain.  Allergic/Immunologic: Negative.   Neurological: Negative.   Hematological: Negative.   Psychiatric/Behavioral: Negative.     Allergies  Ketorolac  Home Medications   Prior to Admission medications   Medication Sig Start Date End Date Taking? Authorizing Provider  ciprofloxacin (CIPRO) 500 MG tablet Take 1 tablet (500 mg total) by mouth 2 (two) times daily. 12/28/16   [name], FNP  metroNIDAZOLE (FLAGYL) 500 MG tablet Take 1 tablet (500 mg total) by mouth 2 (two) times daily. 12/28/16   [name], FNP   Meds Ordered and Administered this Visit  Medications - No data to display  BP (!) 141/83 (BP Location: Right Arm)   Pulse 93   Temp 98.1 F (36.7 C) (Oral)   Resp 18   SpO2 98%  No data found.   Physical Exam   Constitutional: He is oriented to person, place, and time. He appears well-developed and well-nourished.  HENT:  Head: Normocephalic and atraumatic.  Eyes: Conjunctivae and EOM are normal. Pupils are equal, round, and reactive to light.  Neck: Normal range of motion. Neck supple.  Cardiovascular: Normal rate, regular rhythm and normal heart sounds.   Pulmonary/Chest: Effort normal and breath sounds normal.  Abdominal: There is tenderness.  LLQ tenderness no guarding.  Genitourinary:  Genitourinary Comments: No cva tenderness.  Neurological: He is alert and oriented to person, place, and time.  Nursing note and vitals reviewed.   Urgent Care Course     Procedures (including critical care time)  Labs Review Labs Reviewed  POCT URINALYSIS DIP (DEVICE) - Abnormal; Notable for the following:       Result Value   Leukocytes, UA TRACE (*)    All other components within normal limits    Imaging Review No results found.   Visual Acuity Review  Right Eye Distance:   Left Eye Distance:   Bilateral Distance:    Right Eye Near:   Left Eye Near:    Bilateral Near:         MDM   1. Abdominal pain, acute, left lower quadrant    Explained this could possibly be diverticulitis and will tx with cipro and flagyl. Explained if sx's worsen then go to ED UA cx sent      [name], FNP 12/28/16 1309

## 2016-12-28 NOTE — ED Notes (Signed)
Patient is unable to void at this time 

## 2016-12-28 NOTE — Discharge Instructions (Signed)
If pain worsens or not better will need to go to ED

## 2016-12-29 ENCOUNTER — Emergency Department (HOSPITAL_COMMUNITY): Payer: Managed Care, Other (non HMO)

## 2016-12-29 ENCOUNTER — Emergency Department (HOSPITAL_COMMUNITY)
Admission: EM | Admit: 2016-12-29 | Discharge: 2016-12-29 | Disposition: A | Discharge status: Home / Self Care | Payer: Managed Care, Other (non HMO) | Attending: Emergency Medicine | Admitting: Emergency Medicine

## 2016-12-29 ENCOUNTER — Encounter (HOSPITAL_COMMUNITY): Payer: Self-pay | Admitting: Emergency Medicine

## 2016-12-29 DIAGNOSIS — R109 Unspecified abdominal pain: Secondary | ICD-10-CM

## 2016-12-29 DIAGNOSIS — I10 Essential (primary) hypertension: Secondary | ICD-10-CM | POA: Insufficient documentation

## 2016-12-29 DIAGNOSIS — Z79899 Other long term (current) drug therapy: Secondary | ICD-10-CM | POA: Insufficient documentation

## 2016-12-29 DIAGNOSIS — K5792 Diverticulitis of intestine, part unspecified, without perforation or abscess without bleeding: Secondary | ICD-10-CM

## 2016-12-29 LAB — CBC WITH DIFFERENTIAL/PLATELET
Basophils Absolute: 0 10*3/uL (ref 0.0–0.1)
Basophils Relative: 0 %
EOS ABS: 0.2 10*3/uL (ref 0.0–0.7)
EOS PCT: 3 %
HEMATOCRIT: 43.8 % (ref 39.0–52.0)
Hemoglobin: 14.7 g/dL (ref 13.0–17.0)
LYMPHS ABS: 2.1 10*3/uL (ref 0.7–4.0)
Lymphocytes Relative: 35 %
MCH: 29.5 pg (ref 26.0–34.0)
MCHC: 33.6 g/dL (ref 30.0–36.0)
MCV: 88 fL (ref 78.0–100.0)
MONO ABS: 0.5 10*3/uL (ref 0.1–1.0)
MONOS PCT: 8 %
Neutro Abs: 3.4 10*3/uL (ref 1.7–7.7)
Neutrophils Relative %: 54 %
PLATELETS: 203 10*3/uL (ref 150–400)
RBC: 4.98 MIL/uL (ref 4.22–5.81)
RDW: 12.9 % (ref 11.5–15.5)
WBC: 6.2 10*3/uL (ref 4.0–10.5)

## 2016-12-29 LAB — COMPREHENSIVE METABOLIC PANEL
ALK PHOS: 121 U/L (ref 38–126)
ALT: 28 U/L (ref 17–63)
AST: 23 U/L (ref 15–41)
Albumin: 3.8 g/dL (ref 3.5–5.0)
Anion gap: 7 (ref 5–15)
BUN: 14 mg/dL (ref 6–20)
CHLORIDE: 107 mmol/L (ref 101–111)
CO2: 25 mmol/L (ref 22–32)
CREATININE: 0.97 mg/dL (ref 0.61–1.24)
Calcium: 8.8 mg/dL — ABNORMAL LOW (ref 8.9–10.3)
Glucose, Bld: 93 mg/dL (ref 65–99)
Potassium: 4.9 mmol/L (ref 3.5–5.1)
SODIUM: 139 mmol/L (ref 135–145)
Total Bilirubin: 1.1 mg/dL (ref 0.3–1.2)
Total Protein: 7 g/dL (ref 6.5–8.1)

## 2016-12-29 MED ORDER — HYDROCODONE-ACETAMINOPHEN 5-325 MG PO TABS: 1.0000 | ORAL_TABLET | Freq: Four times a day (QID) | ORAL | 0 refills | Status: AC | PRN

## 2016-12-29 MED ORDER — CIPROFLOXACIN IN D5W 400 MG/200ML IV SOLN
400.0000 mg | Freq: Once | INTRAVENOUS | Status: AC
  Administered 2016-12-29: 400 mg via INTRAVENOUS
  Filled 2016-12-29: qty 200

## 2016-12-29 MED ORDER — METRONIDAZOLE IN NACL 5-0.79 MG/ML-% IV SOLN
500.0000 mg | Freq: Once | INTRAVENOUS | Status: AC
  Administered 2016-12-29: 500 mg via INTRAVENOUS
  Filled 2016-12-29: qty 100

## 2016-12-29 MED ORDER — DEXTROSE 5 % IV SOLN: 2.0000 g | Freq: Once | INTRAVENOUS | Status: DC

## 2016-12-29 NOTE — ED Notes (Signed)
Pt ambulated to room from waiting room, tolerated well. Pt placed in gown and on monitor.

## 2016-12-29 NOTE — ED Triage Notes (Signed)
Pt sts left sided flank pain x 3 days; pt sts worse with movement and palpiation

## 2016-12-29 NOTE — Discharge Instructions (Signed)
Start taking both antibiotics that you were prescribed at the urgent care yesterday.  Tylenol as needed for pain. Pain medication prescribed is only for severe pain - This can make you very drowsy - please do not drink alcohol, operate heavy machinery or drive on this medication.  You will need to follow up with the GI clinic listed. You will likely need a colonoscopy. Please call the office today or tomorrow to schedule a follow up appointment.  Return to ER for fevers, vomiting, worsening abdominal pain, new symptoms or any additional concerns.

## 2016-12-29 NOTE — ED Notes (Signed)
Pt unable to void at this time. Stated he used the bathroom before coming here.

## 2016-12-29 NOTE — ED Notes (Signed)
Pt is in stable condition upon d/c and ambulates from ED. 

## 2016-12-29 NOTE — ED Provider Notes (Signed)
[facility] DEPT Provider Note   CSN: [id number] Arrival date & time: 12/29/16  0840     History   Chief Complaint Chief Complaint  Patient presents with  . Flank Pain    HPI Bobby Pennington is a 31 y.o. male.  The history is provided by the patient and medical records. No language interpreter was used.  Flank Pain    Bobby Pennington is a 31 y.o. male  with a PMH of HTN, HLD who presents to the Emergency Department complaining of two days of left flank pain. Patient states pain is constantly dull, but will intermittently become sharp at times. Pain is worse with palpation and movement. Better when lying completely still. He went to the urgent care yesterday who thought he may have diverticulitis and started him on antibiotics. Patient states that he did not get antibiotics filled as he did not think that was what was going on. Patient states he has no abdominal pain and therefore does not believe he has an abdominal infection. He has been taking ibuprofen with some relief. He states that he felt like you to urinate very badly over the last day or 2, but when he goes, not very much comes out. No dysuria. Denies fevers, nausea, vomiting, diarrhea, constipation, blood in the stool, back pain, chest pain, shortness of breath.  Past Medical History:  Diagnosis Date  . Elevated liver enzymes   . High cholesterol   . Hypertension   . Vitamin D deficiency     There are no active problems to display for this patient.   Past Surgical History:  Procedure Laterality Date  . TONSILLECTOMY         Home Medications    Prior to Admission medications   Medication Sig Start Date End Date Taking? Authorizing Provider  ibuprofen (ADVIL,MOTRIN) 200 MG tablet Take 200 mg by mouth every 6 (six) hours as needed for moderate pain.   Yes Historical Provider, MD  ciprofloxacin (CIPRO) 500 MG tablet Take 1 tablet (500 mg total) by mouth 2 (two) times daily. 12/28/16   Bobby Penner, FNP    HYDROcodone-acetaminophen (NORCO/VICODIN) 5-325 MG tablet Take 1 tablet by mouth every 6 (six) hours as needed for severe pain. 12/29/16   Bobby Almond Sharen Youngren, PA-C  metroNIDAZOLE (FLAGYL) 500 MG tablet Take 1 tablet (500 mg total) by mouth 2 (two) times daily. 12/28/16   Bobby Penner, FNP    Family History History reviewed. No pertinent family history.  Social History Social History  Substance Use Topics  . Smoking status: Never Smoker  . Smokeless tobacco: Not on file  . Alcohol use No     Allergies   Ketorolac and Other   Review of Systems Review of Systems  Genitourinary: Positive for flank pain.  All other systems reviewed and are negative.    Physical Exam Updated Vital Signs BP (!) 133/117 (BP Location: Left Arm)   Pulse 70   Temp 97.9 F (36.6 C) (Oral)   Resp 17   SpO2 97%   Physical Exam  Constitutional: He is oriented to person, place, and time. He appears well-developed and well-nourished. No distress.  HENT:  Head: Normocephalic and atraumatic.  Cardiovascular: Normal rate, regular rhythm and normal heart sounds.   No murmur heard. Pulmonary/Chest: Effort normal and breath sounds normal. No respiratory distress.  Abdominal: Soft. Bowel sounds are normal. He exhibits no distension.  Tenderness to palpation of left lower quadrant. Tenderness to palpation along the left flank with no  overlying skin changes. No CVA tenderness.  Musculoskeletal: He exhibits no edema.  Neurological: He is alert and oriented to person, place, and time.  Skin: Skin is warm and dry.  Nursing note and vitals reviewed.    ED Treatments / Results  Labs (all labs ordered are listed, but only abnormal results are displayed) Labs Reviewed  COMPREHENSIVE METABOLIC PANEL - Abnormal; Notable for the following:       Result Value   Calcium 8.8 (*)    All other components within normal limits  CBC WITH DIFFERENTIAL/PLATELET    EKG  EKG Interpretation None        Radiology Ct Renal Stone Study  Result Date: 12/29/2016 CLINICAL DATA:  31 year old male with severe left flank and lower quadrant pain along with urinary urgency for 2 days. Initial encounter. EXAM: CT ABDOMEN AND PELVIS WITHOUT CONTRAST TECHNIQUE: Multidetector CT imaging of the abdomen and pelvis was performed following the standard protocol without IV contrast. COMPARISON:  Chest CTA 08/23/2015. FINDINGS: Lower chest: Normal lung bases.  No pericardial or pleural effusion. Hepatobiliary: Negative noncontrast liver and gallbladder. Pancreas: Negative. Spleen: Negative. Adrenals/Urinary Tract: Normal adrenal glands. Small left renal cortical cyst with simple fluid density is stable since 2016. Otherwise negative noncontrast left kidney and left ureter. Unremarkable urinary bladder. Negative noncontrast right kidney and right ureter. No urologic calculus. Stomach/Bowel: Negative rectum. Sigmoid diverticulosis, but no active sigmoid colon inflammation. However, there is a 5 cm inflamed segment of the descending colon in an area of diverticulosis (series 6, image 41. Mesenteric stranding and mild thickening of the left lateral conal fascia. No definite colonic wall thickening. Trace left gutter fluid. Proximal to this the left colon is normal. Negative splenic flexure. Negative transverse and right colon except for some retained stool. Negative appendix. No dilated small bowel. Negative terminal ileum. Decompressed stomach and duodenum. No other abdominal free fluid. Vascular/Lymphatic: Negative; Vascular patency is not evaluated in the absence of IV contrast. No lymphadenopathy. Reproductive: Negative. Other: No pelvic free fluid. Musculoskeletal: No acute osseous abnormality identified. Mild lower lumbar disc bulging and facet hypertrophy. IMPRESSION: 1. Findings compatible with a 5 cm segment of acute diverticulitis of the distal descending colon colon. Adjacent mesenteric stranding and trace free fluid  in the left gutter. Follow-up colonoscopy is recommended to exclude neoplasm which can sometimes have a similar appearance and presentation. 2. Otherwise negative noncontrast abdomen and pelvis. No urologic calculus. Electronically Signed   By: Genevie Ann M.D.   On: 12/29/2016 10:22    Procedures Procedures (including critical care time)  Medications Ordered in ED Medications  metroNIDAZOLE (FLAGYL) IVPB 500 mg (not administered)  ciprofloxacin (CIPRO) IVPB 400 mg (not administered)     Initial Impression / Assessment and Plan / ED Course  I have reviewed the triage vital signs and the nursing notes.  Pertinent labs & imaging results that were available during my care of the patient were reviewed by me and considered in my medical decision making (see chart for details).    Bobby Pennington is a 31 y.o. male who presents to ED for left sided flank pain. Patient denies abdominal pain, however he was seen at the urgent care yesterday where left lower quadrant tenderness was appreciated on exam. He was given prescription for Cipro and Flagyl and told that his symptoms were likely due to diverticulitis. He did not get this prescription filled because he did not think that he had diverticulitis. He again today denies abdominal pain or stool changes, however  does have focal area of tenderness to the left lower quadrant on exam. He is afebrile and overall very well appearing. His labs are reassuring with a normal white count. CT scan does show a segment of acute diverticulitis of the distal descending colon. Radiology recommends follow-up colonoscopy to exclude neoplasm which can have a similar appearance and presentation. I discussed CT imaging with the patient including follow-up recommendations and concerns. GI follow-up information was provided. Patient does still have his prescriptions for Cipro and Flagyl written by urgent care yesterday. One dose of IV antibiotics given in ED and will have patient  fill previously written prescriptions. Return precautions were discussed. All questions answered. Patient understands and agrees with treatment plan as dictated above.   Final Clinical Impressions(s) / ED Diagnoses   Final diagnoses:  Left flank pain  Diverticulitis    New Prescriptions New Prescriptions   HYDROCODONE-ACETAMINOPHEN (NORCO/VICODIN) 5-325 MG TABLET    Take 1 tablet by mouth every 6 (six) hours as needed for severe pain.     [facility] Joreen Swearingin, PA-C 12/29/16 [place], MD 12/29/16 [phone]

## 2017-03-19 ENCOUNTER — Emergency Department (HOSPITAL_COMMUNITY)
Admission: EM | Admit: 2017-03-19 | Discharge: 2017-03-19 | Disposition: A | Discharge status: Home / Self Care | Payer: Managed Care, Other (non HMO) | Attending: Physician Assistant | Admitting: Physician Assistant

## 2017-03-19 ENCOUNTER — Encounter (HOSPITAL_COMMUNITY): Payer: Self-pay | Admitting: Emergency Medicine

## 2017-03-19 DIAGNOSIS — J029 Acute pharyngitis, unspecified: Secondary | ICD-10-CM | POA: Insufficient documentation

## 2017-03-19 DIAGNOSIS — R52 Pain, unspecified: Secondary | ICD-10-CM | POA: Insufficient documentation

## 2017-03-19 DIAGNOSIS — R509 Fever, unspecified: Secondary | ICD-10-CM | POA: Diagnosis not present

## 2017-03-19 DIAGNOSIS — I10 Essential (primary) hypertension: Secondary | ICD-10-CM | POA: Insufficient documentation

## 2017-03-19 LAB — RAPID STREP SCREEN (MED CTR MEBANE ONLY): Streptococcus, Group A Screen (Direct): NEGATIVE

## 2017-03-19 MED ORDER — DOXYCYCLINE HYCLATE 100 MG PO TABS
100.0000 mg | ORAL_TABLET | Freq: Once | ORAL | Status: AC
  Administered 2017-03-19: 100 mg via ORAL
  Filled 2017-03-19: qty 1

## 2017-03-19 MED ORDER — DOXYCYCLINE HYCLATE 100 MG PO CAPS: 100.0000 mg | ORAL_CAPSULE | Freq: Two times a day (BID) | ORAL | 0 refills | Status: AC

## 2017-03-19 NOTE — ED Provider Notes (Signed)
[facility] DEPT Provider Note   CSN: [id number] Arrival date & time: 03/19/17  1218  By signing my name below, I, Collene Leyden, attest that this documentation has been prepared under the direction and in the presence of Martinique Russo, PA-C. Electronically Signed: Collene Leyden, Scribe. 03/19/17. 1:39 PM.  History   Chief Complaint Chief Complaint  Patient presents with  . Sore Throat    The patient has been having sore throat, fever, chills, body aches.  The patient said he has taken ibuprofen and it brings the fever down but it comes back up.    HPI Comments: Bobby Pennington is a 31 y.o. male with a history of HTN and HLD, who presents to the Emergency Department complaining of a sudden-onset, persistent sore throat that began yesterday. Patient reports developing a gradually worsening sore throat. Patient reports an associated fever (102.8 max), decreased appetite, generalized body aches, and chills. He reports taking ibuprofen and tylenol with mild relief of sore throat and improvement in fever. Patient denies any known tick bites[place]  Patient denies difficulty breathing or swallowing, nausea, vomiting, diarrhea, abdominal pain, cough, ear pain, chest pain, shortness of breath, or any additional symptoms.   The history is provided by the patient. No language interpreter was used.    Past Medical History:  Diagnosis Date  . Elevated liver enzymes   . High cholesterol   . Hypertension   . Vitamin D deficiency     There are no active problems to display for this patient.   Past Surgical History:  Procedure Laterality Date  . TONSILLECTOMY         Home Medications    Prior to Admission medications   Medication Sig Start Date End Date Taking? Authorizing Provider  ciprofloxacin (CIPRO) 500 MG tablet Take 1 tablet (500 mg total) by mouth 2 (two) times daily. 12/28/16   [name], FNP  doxycycline (VIBRAMYCIN) 100 MG capsule Take 1 capsule  (100 mg total) by mouth 2 (two) times daily. 03/19/17 04/02/17  Russo, [place] N, PA-C  HYDROcodone-acetaminophen (NORCO/VICODIN) 5-325 MG tablet Take 1 tablet by mouth every 6 (six) hours as needed for severe pain. 12/29/16   Ward, Ozella Almond, PA-C  ibuprofen (ADVIL,MOTRIN) 200 MG tablet Take 200 mg by mouth every 6 (six) hours as needed for moderate pain.    [provider]  metroNIDAZOLE (FLAGYL) 500 MG tablet Take 1 tablet (500 mg total) by mouth 2 (two) times daily. 12/28/16   [name], FNP    Family History History reviewed. No pertinent family history.  Social History Social History  Substance Use Topics  . Smoking status: Never Smoker  . Smokeless tobacco: Never Used  . Alcohol use No     Allergies   Ketorolac and Other   Review of Systems Review of Systems  Constitutional: Positive for appetite change, chills and fever.  HENT: Positive for sore throat. Negative for congestion, ear pain and trouble swallowing.   Respiratory: Negative for cough and shortness of breath.   Cardiovascular: Negative for chest pain.  Gastrointestinal: Negative for abdominal pain, diarrhea, nausea and vomiting.  Musculoskeletal: Positive for myalgias.  Skin: Negative for rash.  Neurological: Negative for headaches.     Physical Exam Updated Vital Signs BP 117/76   Pulse (!) 119   Temp 100 F (37.8 C) (Oral)   Resp 18   Ht 6' (1.829 m)   Wt 94.8 kg (209 lb)   SpO2 100%  BMI 28.35 kg/m   Physical Exam  Constitutional: He appears well-developed and well-nourished.  HENT:  Head: Normocephalic and atraumatic.  Right Ear: External ear normal.  Left Ear: External ear normal.  Nose: Nose normal.  Mouth/Throat: Oropharynx is clear and moist. No oropharyngeal exudate.  Eyes: Conjunctivae and EOM are normal. Pupils are equal, round, and reactive to light.  Neck: Normal range of motion. Neck supple.  Cardiovascular: Normal rate, regular rhythm, normal heart sounds and  intact distal pulses.  Exam reveals no gallop and no friction rub.   No murmur heard. Pulmonary/Chest: Effort normal and breath sounds normal. No stridor. He has no wheezes. He has no rales.  Lymphadenopathy:    He has no cervical adenopathy.  Skin: Skin is warm and dry. Capillary refill takes less than 2 seconds. No rash noted.  Psychiatric: He has a normal mood and affect. His behavior is normal.  Nursing note and vitals reviewed.    ED Treatments / Results  DIAGNOSTIC STUDIES: Oxygen Saturation is 96% on RA, normal by my interpretation.    COORDINATION OF CARE: 1:39 PM Discussed treatment plan with pt at bedside and pt agreed to plan, which includes doxycyline.   Labs (all labs ordered are listed, but only abnormal results are displayed) Labs Reviewed  RAPID STREP SCREEN (NOT AT [facility])  CULTURE, GROUP A STREP ([place])  ROCKY MTN SPOTTED FVR ABS PNL(IGG+IGM)    EKG  EKG Interpretation None       Radiology No results found.  Procedures Procedures (including critical care time)  Medications Ordered in ED Medications  doxycycline (VIBRA-TABS) tablet 100 mg (100 mg Oral Given 03/19/17 1403)     Initial Impression / Assessment and Plan / ED Course  I have reviewed the triage vital signs and the nursing notes.  Pertinent labs & imaging results that were available during my care of the patient were reviewed by me and considered in my medical decision making (see chart for details).     Patient with flulike symptoms, including sore throat, fever, myalgias. Exam is unremarkable, no rash, lungs CTAB, or tolerating secretions. Given patient's presenting symptoms, recommend prophylactic doxycycline for RMSF. RMSF antibody labs pending. Symptomatic management, and doxycycline Rx. Patient to follow up with PCP or urgent care within 1 week. Strict return precautions given.   Patient discussed with Dr. Thomasene Lot.  Discussed results, findings, treatment and follow up. Patient  advised of return precautions. Patient verbalized understanding and agreed with plan.  Final Clinical Impressions(s) / ED Diagnoses   Final diagnoses:  Sore throat  Body aches    New Prescriptions Discharge Medication List as of 03/19/2017  2:09 PM    START taking these medications   Details  doxycycline (VIBRAMYCIN) 100 MG capsule Take 1 capsule (100 mg total) by mouth 2 (two) times daily., Starting Sun 03/19/2017, Until Sun 04/02/2017, Print       I personally performed the services described in this documentation, which was scribed in my presence. The recorded information has been reviewed and is accurate.     Russo, [place] N, PA-C 03/19/17 1734    Macarthur Critchley, MD 03/19/17 1919

## 2017-03-19 NOTE — ED Notes (Signed)
Waiting for lab tech.

## 2017-03-19 NOTE — Discharge Instructions (Signed)
Please read instructions below. Begin taking the antibiotic to prevent possible illness of [facility] Spotted Fever. Take Doxycycline, 2 times per day, until gone. This medication can cause your skin to be very sensitive to sunlight so wear sunscreen, protective clothing, and avoid prolonged exposure. You will receive a call from the hospital if your test result is positive. You can also check on MyChart for your results.  Follow up with your PCP or urgent care in 1 week if symptoms have not improved. Return to the ER sooner for new or worsening symptoms.

## 2017-03-19 NOTE — ED Triage Notes (Signed)
The patient has been having sore throat, fever, chills, body aches.  The patient said he has taken ibuprofen and it brings the fever down but it comes back up.  He has generalized body aches rates his pain 8/10.

## 2017-03-19 NOTE — ED Notes (Signed)
VS reported to PA.

## 2017-03-21 ENCOUNTER — Emergency Department (HOSPITAL_COMMUNITY)
Admission: EM | Admit: 2017-03-21 | Discharge: 2017-03-21 | Disposition: A | Discharge status: Home / Self Care | Payer: Managed Care, Other (non HMO) | Attending: Emergency Medicine | Admitting: Emergency Medicine

## 2017-03-21 ENCOUNTER — Encounter (HOSPITAL_COMMUNITY): Payer: Self-pay | Admitting: *Deleted

## 2017-03-21 DIAGNOSIS — R21 Rash and other nonspecific skin eruption: Secondary | ICD-10-CM | POA: Diagnosis not present

## 2017-03-21 DIAGNOSIS — I10 Essential (primary) hypertension: Secondary | ICD-10-CM | POA: Insufficient documentation

## 2017-03-21 DIAGNOSIS — Z79899 Other long term (current) drug therapy: Secondary | ICD-10-CM | POA: Insufficient documentation

## 2017-03-21 LAB — CULTURE, GROUP A STREP (THRC)

## 2017-03-21 LAB — ROCKY MTN SPOTTED FVR ABS PNL(IGG+IGM)
RMSF IgG: NEGATIVE
RMSF IgM: 0.22 index (ref 0.00–0.89)

## 2017-03-21 MED ORDER — OXYCODONE-ACETAMINOPHEN 5-325 MG PO TABS: 2.0000 | ORAL_TABLET | Freq: Four times a day (QID) | ORAL | 0 refills | Status: DC | PRN

## 2017-03-21 MED ORDER — OXYCODONE-ACETAMINOPHEN 5-325 MG PO TABS
2.0000 | ORAL_TABLET | Freq: Four times a day (QID) | ORAL | 0 refills | Status: AC | PRN
Start: 2017-03-21 — End: ?

## 2017-03-21 NOTE — ED Provider Notes (Signed)
[facility] DEPT Provider Note   CSN: [id number] Arrival date & time: 03/21/17  2018  By signing my name below, I, Ephriam Jenkins, attest that this documentation has been prepared under the direction and in the presence of Carliss Porcaro PA-C.  Electronically Signed: Ephriam Jenkins, ED Scribe. 03/21/17. 10:55 PM.  History   Chief Complaint Chief Complaint  Patient presents with  . Rash    ?HFM    HPI HPI Comments: Bobby Pennington is a 31 y.o. male who presents to the Emergency Department complaining of a constant, painful red rash to the bottom of his right foot and right hand which started yesterday and has worsened today. Pt was seen here on 03/19/17, two days ago, for symptoms including sore throat and fever (tmax 103). He was discharged and pt was placed on prophylactic doxycycline in concern for RMSF. Pt's sore throat has resolved since being seen two days ago and has not had any recurring fever; he is afebrile here today. Since yesterday pt has had a painful red rash to the sole of his right foot and a milder rash on his right hand and palm. Yesterday, the rash was smaller and much less painful. Today it has worsened and has had a significant exacerbation of pain during ambulation. No nausea, vomiting, shortness of breath or chest pain. No drooling or trouble breathing.  The history is provided by the patient. No language interpreter was used.    Past Medical History:  Diagnosis Date  . Elevated liver enzymes   . High cholesterol   . Hypertension   . Vitamin D deficiency     There are no active problems to display for this patient.   Past Surgical History:  Procedure Laterality Date  . TONSILLECTOMY         Home Medications    Prior to Admission medications   Medication Sig Start Date End Date Taking? Authorizing Provider  ciprofloxacin (CIPRO) 500 MG tablet Take 1 tablet (500 mg total) by mouth 2 (two) times daily. 12/28/16   [name], FNP  doxycycline  (VIBRAMYCIN) 100 MG capsule Take 1 capsule (100 mg total) by mouth 2 (two) times daily. 03/19/17 04/02/17  Russo, [place] N, PA-C  HYDROcodone-acetaminophen (NORCO/VICODIN) 5-325 MG tablet Take 1 tablet by mouth every 6 (six) hours as needed for severe pain. 12/29/16   Ward, Ozella Almond, PA-C  ibuprofen (ADVIL,MOTRIN) 200 MG tablet Take 200 mg by mouth every 6 (six) hours as needed for moderate pain.    [provider]  metroNIDAZOLE (FLAGYL) 500 MG tablet Take 1 tablet (500 mg total) by mouth 2 (two) times daily. 12/28/16   [name], FNP  oxyCODONE-acetaminophen (PERCOCET/ROXICET) 5-325 MG tablet Take 2 tablets by mouth every 6 (six) hours as needed for severe pain. 03/21/17   Delia Heady, PA-C    Family History No family history on file.  Social History Social History  Substance Use Topics  . Smoking status: Never Smoker  . Smokeless tobacco: Never Used  . Alcohol use No     Allergies   Ketorolac and Other   Review of Systems Review of Systems  Constitutional: Negative for fever.  HENT: Negative for sore throat.   Respiratory: Negative for shortness of breath.   Cardiovascular: Negative for chest pain.  Gastrointestinal: Negative for nausea and vomiting.  Skin: Positive for rash.     Physical Exam Updated Vital Signs BP 129/84   Temp 98.3 F (36.8 C) (Oral)   Resp 16  SpO2 99%   Physical Exam  Constitutional: He appears well-developed and well-nourished. No distress.  Patient is nontoxic appearing. He is not in acute distress. He is tolerating secretions with no trismus. No lip swelling or signs of airway compromise noted.  HENT:  Head: Normocephalic and atraumatic.  There are no oral lesions present.  Eyes: Conjunctivae and EOM are normal. No scleral icterus.  Neck: Normal range of motion.  Pulmonary/Chest: Effort normal. No respiratory distress.  Neurological: He is alert.  Skin: Rash noted. He is not diaphoretic.  There are several erythematous  lesions as indicated in the images below on the right sole of the foot as well as the right palm and fingers.  Psychiatric: He has a normal mood and affect.  Nursing note and vitals reviewed.          ED Treatments / Results  DIAGNOSTIC STUDIES: Oxygen Saturation is 99% on RA, normal by my interpretation.  COORDINATION OF CARE: 10:55 PM-Discussed treatment plan with pt at bedside and pt agreed to plan.   Labs (all labs ordered are listed, but only abnormal results are displayed) Labs Reviewed  RPR    EKG  EKG Interpretation None       Radiology No results found.  Procedures Procedures (including critical care time)  Medications Ordered in ED Medications - No data to display   Initial Impression / Assessment and Plan / ED Course  I have reviewed the triage vital signs and the nursing notes.  Pertinent labs & imaging results that were available during my care of the patient were reviewed by me and considered in my medical decision making (see chart for details).     Patient presents to ED for rash on the soles of feet as well as palm. He states that he was recently treated prophylactically for [facility] spotted fever 2 days ago with doxycycline. He states that he has been taking the medication and his sore throat has resolved however he has developed this painful rash on the bottom of his feet and hand. On physical exam there are erythematous lesions on the hand and soles of the foot as indicated above. Pt has a patent airway without stridor and is handling secretions without difficulty; no angioedema. No blisters, no pustules, no warmth, no draining sinus tracts, no superficial abscesses, no bullous impetigo,  no desquamation, no target lesions with dusky purpura or a central bulla. Not tender to touch. No concern for superimposed infection. No concern for SJS, TEN, TSS. He denies any sore throat, trouble breathing, drooling, trismus. He states that his sore  throat has resolved completely.he is afebrile with no history of fever. I informed patient that his symptoms could be due to hand-foot-and-mouth disease but that it is also best to test him for syphilis as this could also present with a rash similar to this on the palms and soles. I suspect that the symptoms are due to hand-foot-and-mouth disease considering that patient had a fever of 103 F prior to his sore throat starting when he was originally seen 2 days ago. However I informed him that there are several possibilities. Patiently agreed to syphilis testing and I advised him to follow up with the results of this when available. I informed him of symptomatic treatment for the hand-foot-and-mouth disease but to return to ED for any worsening or severe symptoms. Patient appears stable for discharge at this time. Strict return precautions given. [place] narcotic database reviewed.  Final Clinical Impressions(s) / ED Diagnoses  Final diagnoses:  Rash and nonspecific skin eruption    New Prescriptions Discharge Medication List as of 03/21/2017 11:10 PM    START taking these medications   Details  oxyCODONE-acetaminophen (PERCOCET/ROXICET) 5-325 MG tablet Take 2 tablets by mouth every 6 (six) hours as needed for severe pain., Starting Tue 03/21/2017, Print      I personally performed the services described in this documentation, which was scribed in my presence. The recorded information has been reviewed and is accurate.      Delia Heady, PA-C 03/22/17 0123    Delia Heady, PA-C 03/23/17 1309    Fredia Sorrow, MD 03/24/17 [phone]

## 2017-03-21 NOTE — ED Notes (Signed)
Pt refused RPR draw.

## 2017-03-21 NOTE — ED Triage Notes (Signed)
Pt was seen Sunday for sore throat, high fever.  As pt is outdoors a lot he was placed on antibiotics for concern that he could have had a tick bite he did not notice and have rmsf. Pt began taking the antibiotics and the fever has subsided.  PT has blister/rash which is very painful on his feet and also (much milder) on his hands.  Blister/rash looks like hand foot mouth.  No further fever.  Blisters began suddenly yesterday, pt reports this is very painful

## 2017-03-21 NOTE — ED Notes (Signed)
Pt is in stable condition upon d/c and ambulates from ED. 

## 2017-04-28 ENCOUNTER — Encounter (HOSPITAL_COMMUNITY): Payer: Self-pay

## 2017-04-28 DIAGNOSIS — R109 Unspecified abdominal pain: Secondary | ICD-10-CM | POA: Diagnosis present

## 2017-04-28 DIAGNOSIS — Z5321 Procedure and treatment not carried out due to patient leaving prior to being seen by health care provider: Secondary | ICD-10-CM | POA: Diagnosis not present

## 2017-04-28 LAB — CBC
HCT: 42.8 % (ref 39.0–52.0)
Hemoglobin: 14.7 g/dL (ref 13.0–17.0)
MCH: 29.3 pg (ref 26.0–34.0)
MCHC: 34.3 g/dL (ref 30.0–36.0)
MCV: 85.4 fL (ref 78.0–100.0)
PLATELETS: 226 10*3/uL (ref 150–400)
RBC: 5.01 MIL/uL (ref 4.22–5.81)
RDW: 12.4 % (ref 11.5–15.5)
WBC: 9.3 10*3/uL (ref 4.0–10.5)

## 2017-04-28 LAB — COMPREHENSIVE METABOLIC PANEL
ALBUMIN: 4.1 g/dL (ref 3.5–5.0)
ALK PHOS: 128 U/L — AB (ref 38–126)
ALT: 38 U/L (ref 17–63)
ANION GAP: 8 (ref 5–15)
AST: 26 U/L (ref 15–41)
BILIRUBIN TOTAL: 1 mg/dL (ref 0.3–1.2)
BUN: 7 mg/dL (ref 6–20)
CALCIUM: 9 mg/dL (ref 8.9–10.3)
CO2: 26 mmol/L (ref 22–32)
CREATININE: 1.09 mg/dL (ref 0.61–1.24)
Chloride: 106 mmol/L (ref 101–111)
GFR calc non Af Amer: >60 mL/min (ref >60)
GLUCOSE: 92 mg/dL (ref 65–99)
Potassium: 3.7 mmol/L (ref 3.5–5.1)
SODIUM: 140 mmol/L (ref 135–145)
TOTAL PROTEIN: 7.1 g/dL (ref 6.5–8.1)

## 2017-04-28 LAB — URINALYSIS, ROUTINE W REFLEX MICROSCOPIC
Bilirubin Urine: NEGATIVE
GLUCOSE, UA: NEGATIVE mg/dL
Hgb urine dipstick: NEGATIVE
Ketones, ur: NEGATIVE mg/dL
NITRITE: NEGATIVE
PH: 5 (ref 5.0–8.0)
Protein, ur: NEGATIVE mg/dL
SPECIFIC GRAVITY, URINE: 1.024 (ref 1.005–1.030)
Squamous Epithelial / LPF: NONE SEEN

## 2017-04-28 LAB — LIPASE, BLOOD: LIPASE: 33 U/L (ref 11–51)

## 2017-04-28 NOTE — ED Triage Notes (Signed)
Pt states that he was cooking dinner and had sudden onset of RLQ abd pain. Pt states he feels like "his stomach exploded". Denies heavy lifting, pt guarding stomach.

## 2017-04-29 ENCOUNTER — Emergency Department (HOSPITAL_COMMUNITY)
Admission: EM | Admit: 2017-04-29 | Discharge: 2017-04-29 | Disposition: A | Discharge status: Home / Self Care | Payer: Managed Care, Other (non HMO) | Attending: Emergency Medicine | Admitting: Emergency Medicine

## 2017-04-29 NOTE — ED Notes (Signed)
Pt called x 3 .  °

## 2017-04-29 NOTE — ED Notes (Signed)
Pt called x 2 

## 2017-04-29 NOTE — ED Notes (Signed)
Pt called x 1 no answer

## 2018-06-26 IMAGING — CT CT RENAL STONE PROTOCOL
2 of 4 series · 16 of 46 positions shown, 18 images · non-contrast
Comparison: Chest CTA 08/23/2015.

CLINICAL DATA: 31-year-old male with severe left flank and lower
quadrant pain along with urinary urgency for 2 days. Initial
encounter.

EXAM:
CT ABDOMEN AND PELVIS WITHOUT CONTRAST
TECHNIQUE: Multidetector CT imaging of the abdomen and pelvis was performed
following the standard protocol without IV contrast.

[Series 3: stone study 5.0 i30f 2 · axial · 0.85mm/px · z∈[+760,+1195]mm · 13 of 95 slices shown, 15 images]
[im 4/95  soft-tissue]
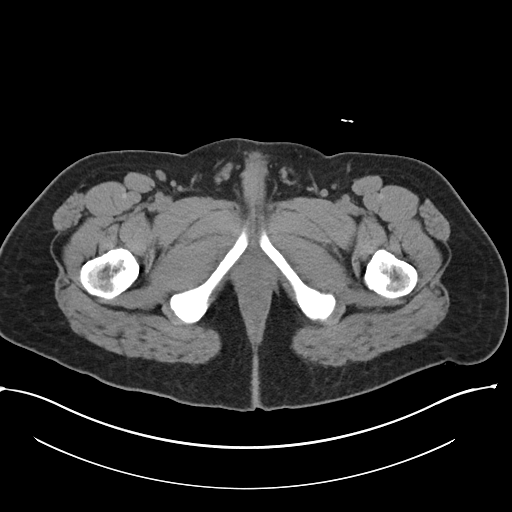
[im 4/95  bone]
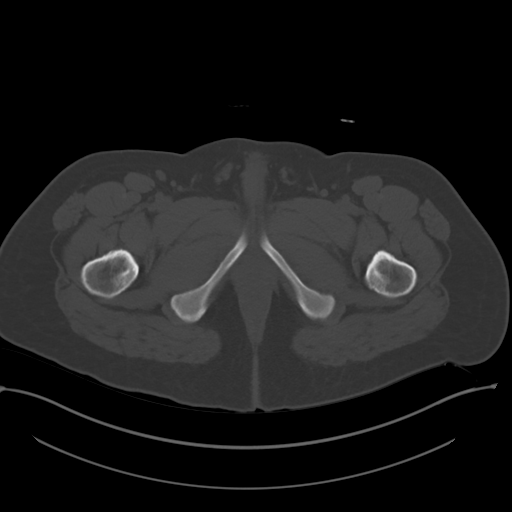
[im 12/95  soft-tissue]
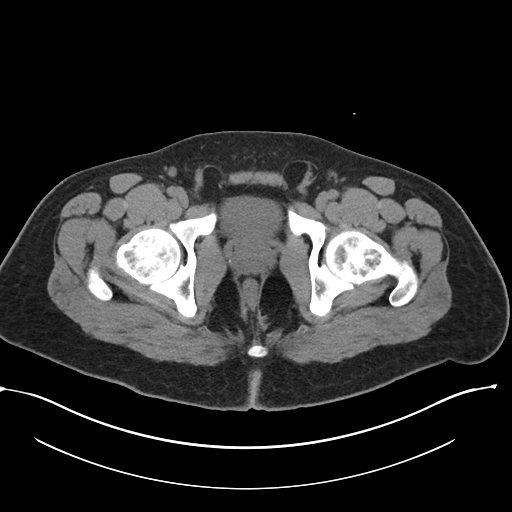
[im 20/95  soft-tissue]
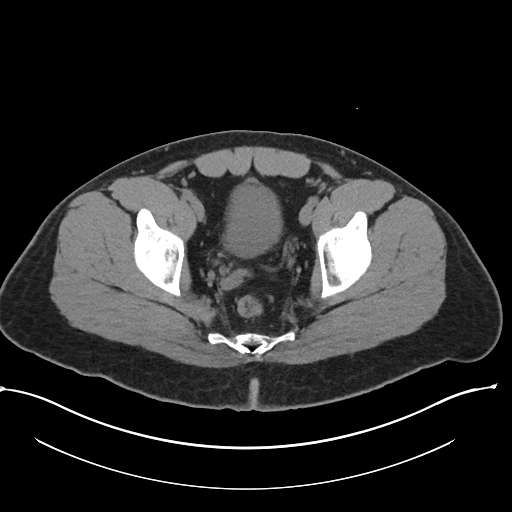
[im 28/95  soft-tissue]
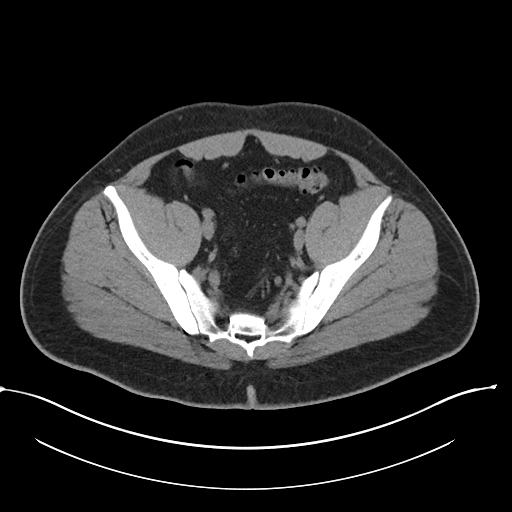
[im 32/95  soft-tissue]
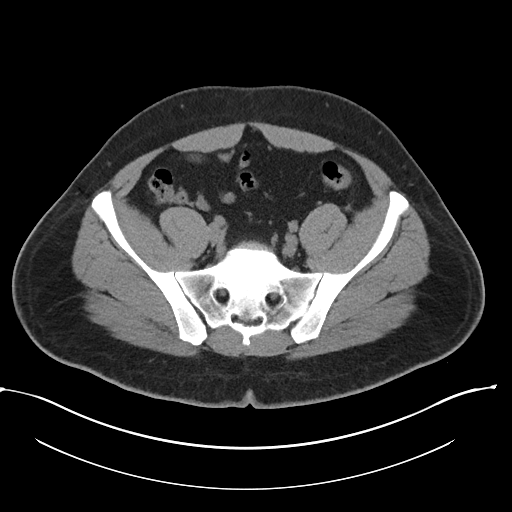
[im 40/95  soft-tissue]
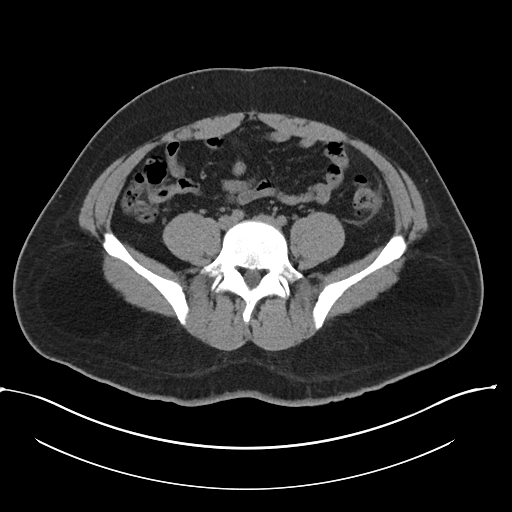
[im 48/95  soft-tissue]
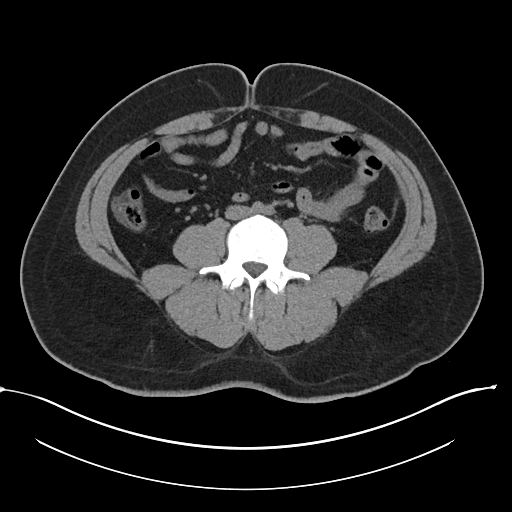
[im 55/95  soft-tissue]
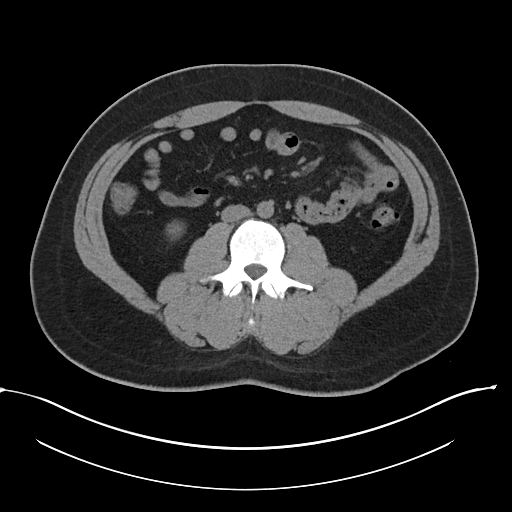
[im 63/95  soft-tissue]
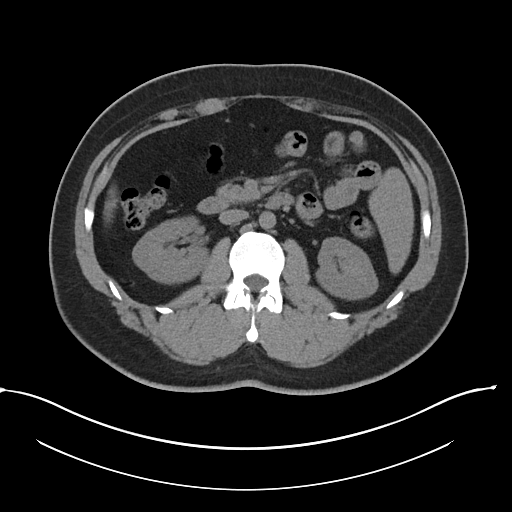
[im 63/95  bone]
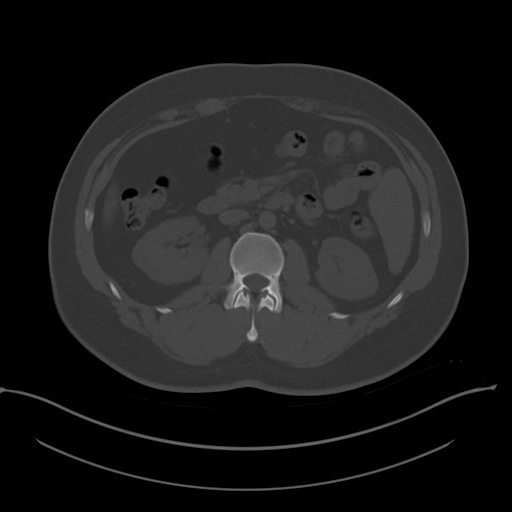
[im 67/95  soft-tissue]
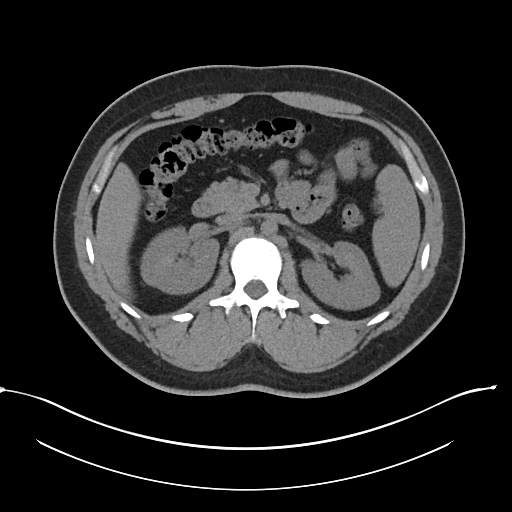
[im 75/95  soft-tissue]
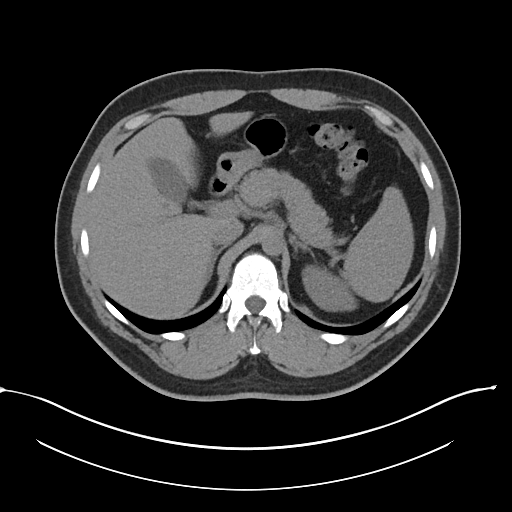
[im 83/95  soft-tissue]
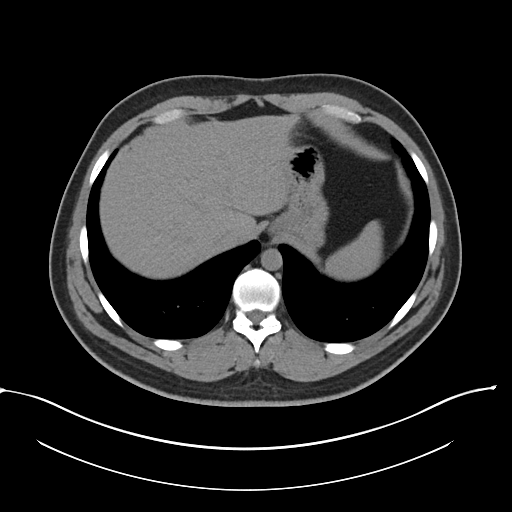
[im 91/95  soft-tissue]
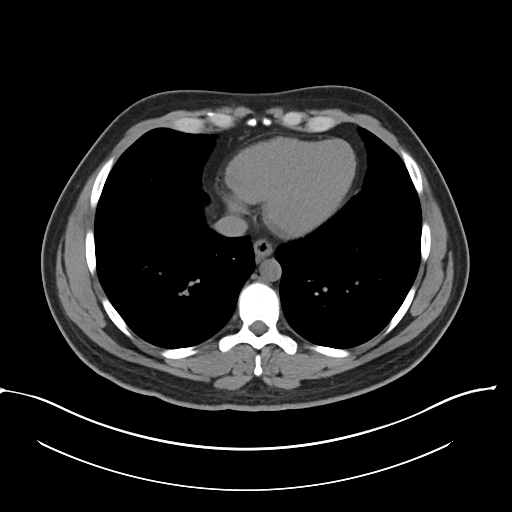

[Series 6: coronal soft tissue · coronal · 0.95mm/px · 3 of 101 slices shown]
[im 34/101  soft-tissue]
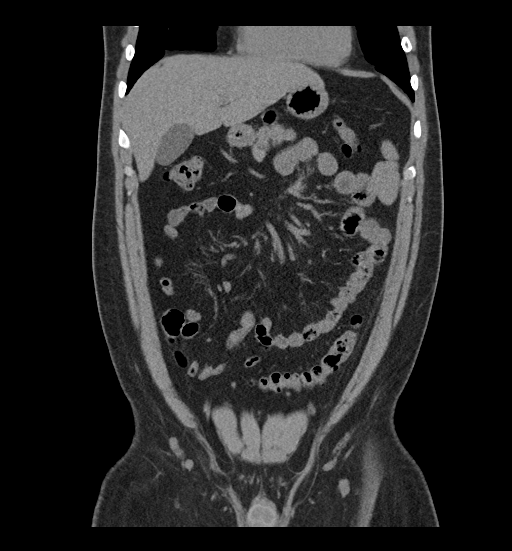
[im 45/101  soft-tissue]
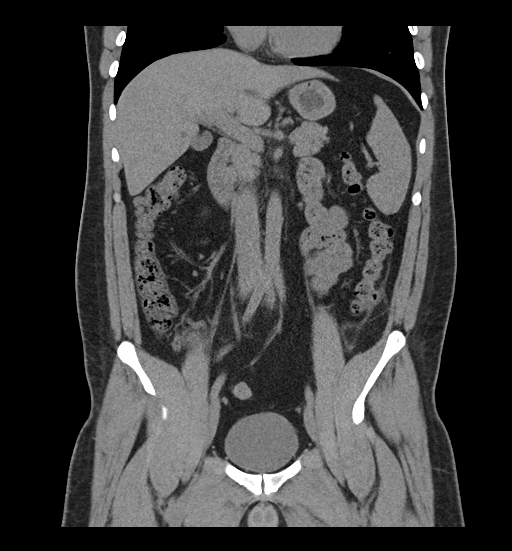
[im 56/101  soft-tissue]
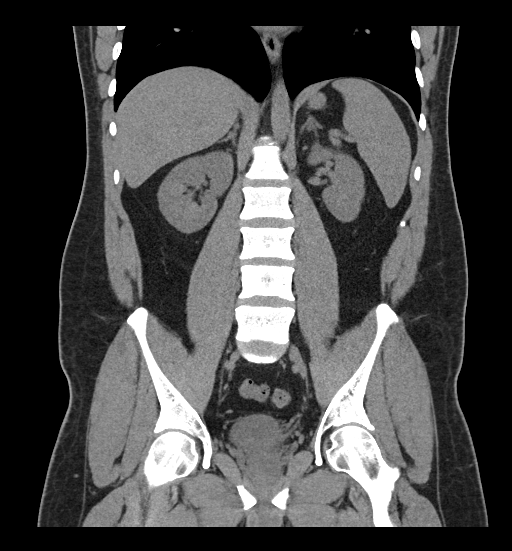

[16 of 46 positions shown; findings below may reference images not displayed]

FINDINGS: Lower chest: Normal lung bases.  No pericardial or pleural effusion.

Hepatobiliary: Negative noncontrast liver and gallbladder.

Pancreas: Negative.

Spleen: Negative.

Adrenals/Urinary Tract: Normal adrenal glands.

Small left renal cortical cyst with simple fluid density is stable
since 1822. Otherwise negative noncontrast left kidney and left
ureter.

Unremarkable urinary bladder.

Negative noncontrast right kidney and right ureter. No urologic
calculus.

Stomach/Bowel: Negative rectum. Sigmoid diverticulosis, but no
active sigmoid colon inflammation.

However, there is a 5 cm inflamed segment of the descending colon in
an area of diverticulosis (series 6, image 41. Mesenteric stranding
and mild thickening of the left lateral conal fascia. No definite
colonic wall thickening. Trace left gutter fluid.

Proximal to this the left colon is normal. Negative splenic flexure.
Negative transverse and right colon except for some retained stool.
Negative appendix. No dilated small bowel. Negative terminal ileum.
Decompressed stomach and duodenum. No other abdominal free fluid.

Vascular/Lymphatic: Negative; Vascular patency is not evaluated in
the absence of IV contrast.

No lymphadenopathy.

Reproductive: Negative.

Other: No pelvic free fluid.

Musculoskeletal: No acute osseous abnormality identified. Mild lower
lumbar disc bulging and facet hypertrophy.
IMPRESSION: 1. Findings compatible with a 5 cm segment of acute diverticulitis
of the distal descending colon colon. Adjacent mesenteric stranding
and trace free fluid in the left gutter.
Follow-up colonoscopy is recommended to exclude neoplasm which can
sometimes have a similar appearance and presentation.
2. Otherwise negative noncontrast abdomen and pelvis. No urologic
calculus.
